# Patient Record
Sex: Male | Born: 1990 | Race: White | Hispanic: No | Marital: Single | Smoking: Current every day smoker
Health system: Southern US, Community
[De-identification: ages and names within clinical notes are randomized; demographics above are authoritative.]

## PROBLEM LIST (undated history)

## (undated) DIAGNOSIS — S62109A Fracture of unspecified carpal bone, unspecified wrist, initial encounter for closed fracture: Secondary | ICD-10-CM

## (undated) DIAGNOSIS — R569 Unspecified convulsions: Secondary | ICD-10-CM

---

## 2015-07-24 ENCOUNTER — Encounter (HOSPITAL_COMMUNITY): Payer: Self-pay | Admitting: Emergency Medicine

## 2015-07-24 ENCOUNTER — Emergency Department (HOSPITAL_COMMUNITY)
Admission: EM | Admit: 2015-07-24 | Discharge: 2015-07-24 | Disposition: A | Discharge status: Home / Self Care | Payer: Self-pay | Attending: Emergency Medicine | Admitting: Emergency Medicine

## 2015-07-24 ENCOUNTER — Emergency Department (HOSPITAL_COMMUNITY): Payer: Self-pay

## 2015-07-24 DIAGNOSIS — S60221A Contusion of right hand, initial encounter: Secondary | ICD-10-CM | POA: Insufficient documentation

## 2015-07-24 DIAGNOSIS — Y9289 Other specified places as the place of occurrence of the external cause: Secondary | ICD-10-CM | POA: Insufficient documentation

## 2015-07-24 DIAGNOSIS — F17210 Nicotine dependence, cigarettes, uncomplicated: Secondary | ICD-10-CM | POA: Insufficient documentation

## 2015-07-24 DIAGNOSIS — X58XXXA Exposure to other specified factors, initial encounter: Secondary | ICD-10-CM | POA: Insufficient documentation

## 2015-07-24 DIAGNOSIS — Y9389 Activity, other specified: Secondary | ICD-10-CM | POA: Insufficient documentation

## 2015-07-24 DIAGNOSIS — Y998 Other external cause status: Secondary | ICD-10-CM | POA: Insufficient documentation

## 2015-07-24 MED ORDER — IBUPROFEN 600 MG PO TABS: 600.0000 mg | ORAL_TABLET | Freq: Four times a day (QID) | ORAL | Status: DC | PRN

## 2015-07-24 NOTE — ED Notes (Signed)
Ace wrap applied per instructions to include fingers and wrist.

## 2015-07-24 NOTE — ED Notes (Signed)
Pt states that he punched a wall last week after finding out that his brother died and used hand to push off of chair yesterday and felt it pop and now is bruised and swelling.

## 2015-07-24 NOTE — Discharge Instructions (Signed)
Hand Contusion  A hand contusion is a deep bruise on your hand area. Contusions are the result of an injury that caused bleeding under the skin. The contusion may turn blue, purple, or yellow. Minor injuries will give you a painless contusion, but more severe contusions may stay painful and swollen for a few weeks.  CAUSES   A contusion is usually caused by a blow, trauma, or direct force to an area of the body.  SYMPTOMS    Swelling and redness of the injured area.   Discoloration of the injured area.   Tenderness and soreness of the injured area.   Pain.  DIAGNOSIS   The diagnosis can be made by taking a history and performing a physical exam. An X-ray, CT scan, or MRI may be needed to determine if there were any associated injuries, such as broken bones (fractures).  TREATMENT   Often, the best treatment for a hand contusion is resting, elevating, icing, and applying cold compresses to the injured area. Over-the-counter medicines may also be recommended for pain control.  HOME CARE INSTRUCTIONS    Put ice on the injured area.    Put ice in a plastic bag.    Place a towel between your skin and the bag.    Leave the ice on for 15-20 minutes, 03-04 times a day.   Only take over-the-counter or prescription medicines as directed by your caregiver. Your caregiver may recommend avoiding anti-inflammatory medicines (aspirin, ibuprofen, and naproxen) for 48 hours because these medicines may increase bruising.   If told, use an elastic wrap as directed. This can help reduce swelling. You may remove the wrap for sleeping, showering, and bathing. If your fingers become numb, cold, or blue, take the wrap off and reapply it more loosely.   Elevate your hand with pillows to reduce swelling.   Avoid overusing your hand if it is painful.  SEEK IMMEDIATE MEDICAL CARE IF:    You have increased redness, swelling, or pain in your hand.   Your swelling or pain is not relieved with medicines.   You have loss of feeling in  your hand or are unable to move your fingers.   Your hand turns cold or blue.   You have pain when you move your fingers.   Your hand becomes warm to the touch.   Your contusion does not improve in 2 days.  MAKE SURE YOU:    Understand these instructions.   Will watch your condition.   Will get help right away if you are not doing well or get worse.     This information is not intended to replace advice given to you by your health care provider. Make sure you discuss any questions you have with your health care provider.     Document Released: 12/20/2001 Document Revised: 03/24/2012 Document Reviewed: 12/22/2011  Elsevier Interactive Patient Education 2016 Elsevier Inc.

## 2015-07-25 NOTE — ED Provider Notes (Signed)
CSN: [id number]     Arrival date & time 07/24/15  1606 History   First MD Initiated Contact with Patient 07/24/15 1722     Chief Complaint  Patient presents with  . Hand Injury     (Consider location/radiation/quality/duration/timing/severity/associated sxs/prior Treatment) The history is provided by the patient and a relative.   Jacob Pennington is a 25 y.o. right handed male presenting with pain along the 2nd, 3rd and 4th mcp joints since punching a wall last week.  He endorses his pain was better until yesterday when he pushed up from a chair yesterday and now has increased pain and swelling at the base of his right index finger. Pain is constant, aching and worsened with movement.  It radiates across his dorsal distal hand.  He denies numbness or weakness in his fingers although movement worsens pain. He has had no treatment prior to arrival.   History reviewed. No pertinent past medical history. History reviewed. No pertinent past surgical history. History reviewed. No pertinent family history. Social History  Substance Use Topics  . Smoking status: Current Every Day Smoker -- 0.50 packs/day    Types: Cigarettes  . Smokeless tobacco: None  . Alcohol Use: No    Review of Systems  Constitutional: Negative for fever.  Musculoskeletal: Positive for joint swelling and arthralgias. Negative for myalgias.  Skin: Negative for wound.  Neurological: Negative for weakness and numbness.      Allergies  Review of patient's allergies indicates no known allergies.  Home Medications   Prior to Admission medications   Medication Sig Start Date End Date Taking? Authorizing Provider  ibuprofen (ADVIL,MOTRIN) 600 MG tablet Take 1 tablet (600 mg total) by mouth every 6 (six) hours as needed. 07/24/15   Burgess Amor, PA-C   BP 110/75 mmHg  Pulse 75  Temp(Src) 98 F (36.7 C) (Oral)  Resp 16  Ht 5\' 7"  (1.702 m)  Wt 61.236 kg  BMI 21.14 kg/m2  SpO2 100% Physical Exam  Constitutional: He  appears well-developed and well-nourished.  HENT:  Head: Atraumatic.  Neck: Normal range of motion.  Cardiovascular:  Pulses equal bilaterally  Musculoskeletal: He exhibits tenderness.       Hands: Neurological: He is alert. He has normal strength. He displays normal reflexes. No sensory deficit.  Equal grip strength.  Skin: Skin is warm and dry.  Psychiatric: He has a normal mood and affect.    ED Course  Procedures (including critical care time) Labs Review Labs Reviewed - No data to display  Imaging Review Dg Hand Complete Right  07/24/2015  CLINICAL DATA:  Pain in the hand. The patient punched a wall 1 week ago. Swelling and bruising along the third and fourth metacarpals. EXAM: RIGHT HAND - COMPLETE 3+ VIEW COMPARISON:  None. FINDINGS: Sclerosis and collapse of the lunate noted. Appearance compatible with avascular necrosis/lunatomalacia. No metacarpal or phalangeal fracture is observed. Elevated scapholunate angle. IMPRESSION: 1. Kienbock's lunatomalacia, with collapse, fragmentation, and sclerosis of the lunate and elevated scapholunate angle. 2. I do not observe a fracture of the metacarpals or phalanges. Electronically Signed   By: Gaylyn Rong M.D.   On: 07/24/2015 16:30   I have personally reviewed and evaluated these images and lab results as part of my medical decision-making.   EKG Interpretation None      MDM   Final diagnoses:  Hand contusion, right, initial encounter    xrays reviewed and discussed with patient.  He endorses old fall injury to right hand/wrist with chronic  pain in the wrist and decreased ability at extension.  He has never sought tx for this injury which he denies being worsened today.  Discussed xrays findings and recommended f/u with ortho, referral given.  Ace wrap supplied, advised RICE, ibuprofen.  Encouraged to f/u re old injury as well.  The patient appears reasonably screened and/or stabilized for discharge and I doubt any other  medical condition or other [facility] requiring further screening, evaluation, or treatment in the ED at this time prior to discharge.    Burgess AmorJulie Ademola Vert, PA-C 07/25/15 1426  Samuel JesterKathleen McManus, DO 07/28/15 [phone]

## 2015-08-05 ENCOUNTER — Encounter (HOSPITAL_COMMUNITY): Payer: Self-pay | Admitting: *Deleted

## 2015-08-05 ENCOUNTER — Emergency Department (HOSPITAL_COMMUNITY)
Admission: EM | Admit: 2015-08-05 | Discharge: 2015-08-06 | Disposition: A | Discharge status: Home / Self Care | Payer: Self-pay | Attending: Emergency Medicine | Admitting: Emergency Medicine

## 2015-08-05 DIAGNOSIS — R0602 Shortness of breath: Secondary | ICD-10-CM | POA: Insufficient documentation

## 2015-08-05 DIAGNOSIS — Z79899 Other long term (current) drug therapy: Secondary | ICD-10-CM | POA: Insufficient documentation

## 2015-08-05 DIAGNOSIS — W01198A Fall on same level from slipping, tripping and stumbling with subsequent striking against other object, initial encounter: Secondary | ICD-10-CM | POA: Insufficient documentation

## 2015-08-05 DIAGNOSIS — S299XXA Unspecified injury of thorax, initial encounter: Secondary | ICD-10-CM | POA: Insufficient documentation

## 2015-08-05 DIAGNOSIS — G4489 Other headache syndrome: Secondary | ICD-10-CM | POA: Insufficient documentation

## 2015-08-05 DIAGNOSIS — Y93E1 Activity, personal bathing and showering: Secondary | ICD-10-CM | POA: Insufficient documentation

## 2015-08-05 DIAGNOSIS — R000 Tachycardia, unspecified: Secondary | ICD-10-CM | POA: Insufficient documentation

## 2015-08-05 DIAGNOSIS — Y9289 Other specified places as the place of occurrence of the external cause: Secondary | ICD-10-CM | POA: Insufficient documentation

## 2015-08-05 DIAGNOSIS — F17210 Nicotine dependence, cigarettes, uncomplicated: Secondary | ICD-10-CM | POA: Insufficient documentation

## 2015-08-05 DIAGNOSIS — F419 Anxiety disorder, unspecified: Secondary | ICD-10-CM | POA: Insufficient documentation

## 2015-08-05 DIAGNOSIS — R55 Syncope and collapse: Secondary | ICD-10-CM | POA: Insufficient documentation

## 2015-08-05 DIAGNOSIS — Y998 Other external cause status: Secondary | ICD-10-CM | POA: Insufficient documentation

## 2015-08-05 HISTORY — DX: Unspecified convulsions: R56.9

## 2015-08-05 LAB — I-STAT CHEM 8, ED
BUN: 21 mg/dL — ABNORMAL HIGH (ref 6–20)
CALCIUM ION: 1.12 mmol/L (ref 1.12–1.23)
CREATININE: 1.2 mg/dL (ref 0.61–1.24)
Chloride: 104 mmol/L (ref 101–111)
GLUCOSE: 100 mg/dL — AB (ref 65–99)
HCT: 49 % (ref 39.0–52.0)
HEMOGLOBIN: 16.7 g/dL (ref 13.0–17.0)
POTASSIUM: 3.8 mmol/L (ref 3.5–5.1)
Sodium: 143 mmol/L (ref 135–145)
TCO2: 26 mmol/L (ref 0–100)

## 2015-08-05 LAB — URINALYSIS, ROUTINE W REFLEX MICROSCOPIC
BILIRUBIN URINE: NEGATIVE
GLUCOSE, UA: NEGATIVE mg/dL
HGB URINE DIPSTICK: NEGATIVE
KETONES UR: 15 mg/dL — AB
Leukocytes, UA: NEGATIVE
Nitrite: NEGATIVE
PROTEIN: NEGATIVE mg/dL
Specific Gravity, Urine: 1.025 (ref 1.005–1.030)
pH: 6 (ref 5.0–8.0)

## 2015-08-05 LAB — RAPID URINE DRUG SCREEN, HOSP PERFORMED
Amphetamines: NOT DETECTED
Barbiturates: NOT DETECTED
Benzodiazepines: POSITIVE — AB
Cocaine: NOT DETECTED
OPIATES: NOT DETECTED
Tetrahydrocannabinol: POSITIVE — AB

## 2015-08-05 LAB — I-STAT TROPONIN, ED: TROPONIN I, POC: 0 ng/mL (ref 0.00–0.08)

## 2015-08-05 NOTE — ED Notes (Signed)
Pt states that he became lightheaded when he started to stand and woke up on the floor that happened yesterday, pt reports that this is the second time he has had this happen,
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
## 2015-08-06 ENCOUNTER — Emergency Department (HOSPITAL_COMMUNITY): Payer: Self-pay

## 2015-08-06 NOTE — Discharge Instructions (Signed)
Follow up with Dr. Gerilyn Pilgrim at [address] [place] [place], [place] [address]    [phone]

## 2015-11-19 ENCOUNTER — Encounter (HOSPITAL_COMMUNITY): Payer: Self-pay | Admitting: Emergency Medicine

## 2015-11-19 ENCOUNTER — Emergency Department (HOSPITAL_COMMUNITY)
Admission: EM | Admit: 2015-11-19 | Discharge: 2015-11-19 | Disposition: A | Discharge status: Home / Self Care | Payer: Self-pay | Attending: Emergency Medicine | Admitting: Emergency Medicine

## 2015-11-19 DIAGNOSIS — K047 Periapical abscess without sinus: Secondary | ICD-10-CM | POA: Insufficient documentation

## 2015-11-19 DIAGNOSIS — R590 Localized enlarged lymph nodes: Secondary | ICD-10-CM | POA: Insufficient documentation

## 2015-11-19 DIAGNOSIS — F17210 Nicotine dependence, cigarettes, uncomplicated: Secondary | ICD-10-CM | POA: Insufficient documentation

## 2015-11-19 MED ORDER — ACETAMINOPHEN 325 MG PO TABS
650.0000 mg | ORAL_TABLET | Freq: Once | ORAL | Status: AC
  Administered 2015-11-19: 650 mg via ORAL
  Filled 2015-11-19: qty 2

## 2015-11-19 MED ORDER — CEFTRIAXONE SODIUM 1 G IJ SOLR
1.0000 g | Freq: Once | INTRAMUSCULAR | Status: AC
  Administered 2015-11-19: 1 g via INTRAMUSCULAR
  Filled 2015-11-19: qty 10

## 2015-11-19 MED ORDER — AMOXICILLIN 500 MG PO CAPS: 500.0000 mg | ORAL_CAPSULE | Freq: Three times a day (TID) | ORAL | Status: AC

## 2015-11-19 MED ORDER — IBUPROFEN 800 MG PO TABS: 800.0000 mg | ORAL_TABLET | Freq: Three times a day (TID) | ORAL | Status: DC

## 2015-11-19 MED ORDER — IBUPROFEN 800 MG PO TABS
800.0000 mg | ORAL_TABLET | Freq: Once | ORAL | Status: AC
  Administered 2015-11-19: 800 mg via ORAL
  Filled 2015-11-19: qty 1

## 2015-11-19 MED ORDER — LIDOCAINE HCL (PF) 1 % IJ SOLN
INTRAMUSCULAR | Status: AC
  Filled 2015-11-19: qty 5

## 2015-11-19 NOTE — Discharge Instructions (Signed)
Please swish the abscess area with salt water swishes three or four times daily.[employer] 211 is a great source of information about community services available.  Access by dialing 2-1-1 from anywhere in [place], or by website -  PooledIncome.plwww.nc211.org.   Other Local Resources (Updated 07/2015)  Dental  Care   Services    Phone Number and Address  Cost  [facility] For children 210 - 26 years of age:   Cleaning  Tooth brushing/flossing instruction  Sealants, fillings, crowns  Extractions  Emergency treatment  [phone] [address] [place], [place] [address] Charges based on family income.  Medicaid and some insurance plans accepted.     [facility]  Cleaning  Sealants, fillings, crowns  Extractions  Emergency treatment [phone] [address] [place], [place]  Pregnant women 26 years of age or older with a Medicaid card  [facility] [place]  Cleaning  Sealants, fillings, crowns  Extractions  Emergency treatment [phone] [address] [place], [place] Pregnant women 26 years of age or older with a Medicaid card  [facility] For children 450 - 26 years of age:   Cleaning  Tooth brushing/flossing instruction  Sealants, fillings, crowns  Extractions  Emergency treatment Limited orthodontic services for patients with Medicaid [phone] [address] [place], [place] [address] Medicaid and [facility] Health Choice cover for children up to age 26 and pregnant women.  Parents of children up to age 26 without Medicaid pay a reduced fee at time of service.  [facility] [place] For children 590 - 26 years of age:   Cleaning  Tooth brushing/flossing instruction  Sealants, fillings,  crowns  Extractions  Emergency treatment Limited orthodontic services for patients with Medicaid [phone] [address] [place], [place].  Medicaid and [place] Health Choice cover for children up to age 26 and pregnant women.  Parents of children up to age 26 without Medicaid pay a reduced fee.  [facility]  Cleaning  Sealants, fillings, crowns  Extractions  Hours: Tuesdays and Thursdays, 4:15 - 8 pm [phone] [address], [address] [place], [place] [address] Services free of charge to [facility] residents ages 18-64 who do not have health insurance, Medicare, [place], or [place] benefits and fall within federal poverty guidelines  [facility]    Provides dental care in addition to primary medical care, nutritional counseling, and pharmacy:  Nurse, mental healthCleaning  Sealants, fillings, crowns  Extractions                  [phone] [facility], [address] [place], [place]  [phone] [facility], [address] [place], [place]  [phone] [facility] [place], [place]  [phone] [facility], [address] [place], [place]  [phone] [facility] [address] [place], [place] Accepts [place], [place]are, most insurance.  Also provides services available to all with fees adjusted based on ability to pay.    [facility]  Cleaning  Tooth brushing/flossing instruction  Sealants, fillings, crowns  Extractions  Emergency treatment Hours: Tuesdays, Thursdays, and Fridays from 8 am to 5 pm by appointment only. [phone] [address] [place], [place] [phone] [facility] residents with Medicaid (depending  on eligibility) and children with [place] Health Choice - call for more information.  [facility]  Extractions only  Hours: 2nd and  4th Thursday of each month from 6:30 am - 9 am.   [phone]. [phone] [place], [place] [address] Ages 59 and older only.  Patients are seen on a first come, first served basis.  [employer]  [employer]  Extractions  Orthodontics  Endodontics  Implants/Crowns/Bridges  Complete and partial dentures [phone] [place], [place] Patients must complete an application for services.  There is often a waiting list.   th salt water swishes 34 times daily. It is extremely important that you see a dentist systems possible. Use Amoxil and ibuprofen 3 times daily with a meal.

## 2015-11-19 NOTE — ED Provider Notes (Signed)
CSN: [id number]     Arrival date & time 11/19/15  1947 History   First MD Initiated Contact with Patient 11/19/15 2026     Chief Complaint  Patient presents with  . Dental Pain     (Consider location/radiation/quality/duration/timing/severity/associated sxs/prior Treatment) Patient is a 25 y.o. male presenting with tooth pain. The history is provided by the patient.  Dental Pain Location:  Lower Lower teeth location:  20/LL 2nd bicuspid, 19/LL 1st molar, 18/LL 2nd molar and 17/LL 3rd molar Quality:  Throbbing, aching and pressure-like Severity:  Moderate Onset quality:  Gradual Duration:  4 days Timing:  Intermittent Progression:  Worsening Chronicity:  Recurrent Context: abscess, dental caries and poor dentition   Relieved by:  Nothing Worsened by:  Nothing tried Associated symptoms: facial swelling, gum swelling and headaches   Associated symptoms: no fever   Risk factors: lack of dental care and smoking   Risk factors: no diabetes and no immunosuppression     Past Medical History  Diagnosis Date  . 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 No pertinent past surgical history. History reviewed. No pertinent family history. Social History  Substance Use Topics  . Smoking status: Current Every Day Smoker -- 0.50 packs/day    Types: Cigarettes  . Smokeless tobacco: None  . Alcohol Use: No     Comment: occasionally    Review of Systems  Constitutional: Negative for fever.  HENT: Positive for dental problem and facial swelling.   Neurological: Positive for headaches.  All other systems reviewed and are negative.     Allergies  Review of patient's allergies indicates no known allergies.  Home Medications   Prior to Admission medications   Medication Sig Start Date End Date Taking? Authorizing Provider  aspirin-acetaminophen-caffeine (EXCEDRIN MIGRAINE) [id number] MG tablet Take 2 tablets by mouth every 6 (six) hours as needed  for headache.    Historical Provider, MD  Aspirin-Salicylamide-Caffeine (BC HEADACHE) 325-95-16 MG TABS Take 1 packet by mouth daily as needed (for pain).    Historical Provider, MD  HYDROcodone-acetaminophen (NORCO/VICODIN) 5-325 MG tablet Take 1 tablet by mouth every 6 (six) hours as needed for moderate pain.    Historical Provider, MD  ibuprofen (ADVIL,MOTRIN) 600 MG tablet Take 1 tablet (600 mg total) by mouth every 6 (six) hours as needed. Patient not taking: Reported on 08/05/2015 07/24/15   Burgess Amor, PA-C  omeprazole (PRILOSEC) 20 MG capsule Take 20 mg by mouth daily.    Historical Provider, MD   BP 139/95 mmHg  Pulse 100  Temp(Src) 99.1 F (37.3 C) (Oral)  Resp 14  Ht  (1.702 m)  Wt 58.968 kg  BMI 20.36 kg/m2  SpO2 100% Physical Exam  Constitutional: He is oriented to person, place, and time. He appears well-developed and well-nourished.  Non-toxic appearance.  HENT:  Head: Normocephalic.  Right Ear: Tympanic membrane and external ear normal.  Left Ear: Tympanic membrane and external ear normal.  There are multiple dental caries noted throughout the mouth. There is swelling of the left lower gum with an abscess present. He there is tenderness to palpation of the lower jaw, extending into the upper portion of the neck. The airway is patent. There is no swelling under the tongue.  Eyes: EOM and lids are normal. Pupils are equal, round, and reactive to light.  Neck: Normal range of motion. Neck supple. Carotid bruit is not present. No tracheal deviation present.  Cardiovascular:  Normal rate, regular rhythm, normal heart sounds, intact distal pulses and normal pulses.  Exam reveals no gallop and no friction rub.   No murmur heard. Pulmonary/Chest: Breath sounds normal. No respiratory distress.  Abdominal: Soft. Bowel sounds are normal. There is no tenderness. There is no guarding.  Musculoskeletal: Normal range of motion.  Lymphadenopathy:       Head (right side): No  submandibular adenopathy present.       Head (left side): No submandibular adenopathy present.    He has cervical adenopathy.  Neurological: He is alert and oriented to person, place, and time. He has normal strength. No cranial nerve deficit or sensory deficit.  Skin: Skin is warm and dry.  Psychiatric: He has a normal mood and affect. His speech is normal.  Nursing note and vitals reviewed.   ED Course  Dental Date/Time: 11/19/2015 9:29 PM Performed by: Ivery QualeBRYANT, Chele Cornell Authorized by: Ivery QualeBRYANT, Lamontae Ricardo Consent: Verbal consent obtained. Risks and benefits: risks, benefits and alternatives were discussed Consent given by: patient Patient understanding: patient states understanding of the procedure being performed Required items: required blood products, implants, devices, and special equipment available Patient identity confirmed: arm band Time out: Immediately prior to procedure a "time out" was called to verify the correct patient, procedure, equipment, support staff and site/side marked as required. Preparation: Patient was prepped and draped in the usual sterile fashion. Local anesthesia used: yes Anesthesia: nerve block Local anesthetic: lidocaine 2% with epinephrine Anesthetic total: 4 ml Patient sedated: no Patient tolerance: Patient tolerated the procedure well with no immediate complications Comments: Performed incision and drainage of dental abscess using a #11 scalpel, with copious amount of purulent material drained. Significant reduction in abscess at the left lower jaw. Patient reports being much more comfortable after the procedure.   (including critical care time) Labs Review Labs Reviewed - No data to display  Imaging Review No results found. I have personally reviewed and evaluated these images and lab results as part of my medical decision-making.   EKG Interpretation None      MDM  Patient noted to have multiple dental caries present. Particularly bad at the  left lower jaw area. Patient had a abscess present. This was incised and drained with copious amount of purulent material. Patient was treated with intramuscular Rocephin, and placed on Amoxil. I have advised the patient to see the dentist systems possible. We discussed possible negative outcomes. Prescription for Amoxil and ibuprofen given to the patient. Patient acknowledges understanding of the importance of seeing the dentist and the discharge plan.    Final diagnoses:  Dental abscess    *I have reviewed nursing notes, vital signs, and all appropriate lab and imaging results for this patient.[address]    Dahmir Epperly, PA-C 11/20/15 1233  Doug SouSam Jacubowitz, MD 11/21/15 1157

## 2015-11-19 NOTE — ED Notes (Addendum)
Patient complaining of lower left dental pain x 3-4 days ago. Swelling noted to left side of face.

## 2015-12-30 ENCOUNTER — Encounter (HOSPITAL_COMMUNITY): Payer: Self-pay

## 2015-12-30 ENCOUNTER — Emergency Department (HOSPITAL_COMMUNITY)
Admission: EM | Admit: 2015-12-30 | Discharge: 2015-12-30 | Disposition: A | Discharge status: Home / Self Care | Payer: Self-pay | Attending: Emergency Medicine | Admitting: Emergency Medicine

## 2015-12-30 DIAGNOSIS — K05219 Aggressive periodontitis, localized, unspecified severity: Secondary | ICD-10-CM

## 2015-12-30 DIAGNOSIS — F17210 Nicotine dependence, cigarettes, uncomplicated: Secondary | ICD-10-CM | POA: Insufficient documentation

## 2015-12-30 DIAGNOSIS — Z7982 Long term (current) use of aspirin: Secondary | ICD-10-CM | POA: Insufficient documentation

## 2015-12-30 DIAGNOSIS — K0520 Aggressive periodontitis, unspecified: Secondary | ICD-10-CM | POA: Insufficient documentation

## 2015-12-30 MED ORDER — CLINDAMYCIN HCL 300 MG PO CAPS: 300.0000 mg | ORAL_CAPSULE | Freq: Four times a day (QID) | ORAL | Status: AC

## 2015-12-30 MED ORDER — CLINDAMYCIN HCL 150 MG PO CAPS
300.0000 mg | ORAL_CAPSULE | Freq: Once | ORAL | Status: AC
  Administered 2015-12-30: 300 mg via ORAL
  Filled 2015-12-30: qty 2

## 2015-12-30 NOTE — ED Provider Notes (Signed)
CSN: [id number]     Arrival date & time 12/30/15  [id number] History   First MD Initiated Contact with Patient 12/30/15 [phone]     Chief Complaint  Patient presents with  . Abscess     (Consider location/radiation/quality/duration/timing/severity/associated sxs/prior Treatment) HPI...Marland Kitchen.Marland Kitchen.Swelling left lower jaw for 24 hours. Poor dental care in the past. No fever, sweats, chills, stiff neck. Similar abscess approximately one month ago. He is normally healthy.  Past Medical History  Diagnosis Date  . 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 No pertinent past surgical history. History reviewed. No pertinent family history. Social History  Substance Use Topics  . Smoking status: Current Every Day Smoker -- 0.50 packs/day    Types: Cigarettes  . Smokeless tobacco: None  . Alcohol Use: No     Comment: occasionally    Review of Systems  All other systems reviewed and are negative.     Allergies  Review of patient's allergies indicates no known allergies.  Home Medications   Prior to Admission medications   Medication Sig Start Date End Date Taking? Authorizing Provider  amoxicillin (AMOXIL) 500 MG capsule Take 1 capsule (500 mg total) by mouth 3 (three) times daily. 11/19/15   Ivery QualeHobson Bryant, PA-C  aspirin-acetaminophen-caffeine (EXCEDRIN MIGRAINE) 9094338849250-250-65 MG tablet Take 2 tablets by mouth every 6 (six) hours as needed for headache.    Historical Provider, MD  Aspirin-Salicylamide-Caffeine (BC HEADACHE) 325-95-16 MG TABS Take 1 packet by mouth daily as needed (for pain).    Historical Provider, MD  clindamycin (CLEOCIN) 300 MG capsule Take 1 capsule (300 mg total) by mouth 4 (four) times daily. 12/30/15   Donnetta HutchingBrian Amarys Sliwinski, MD  HYDROcodone-acetaminophen (NORCO/VICODIN) 5-325 MG tablet Take 1 tablet by mouth every 6 (six) hours as needed for moderate pain.    Historical Provider, MD  ibuprofen (ADVIL,MOTRIN) 800 MG tablet Take 1 tablet (800 mg total) by mouth  3 (three) times daily. 11/19/15   Ivery QualeHobson Bryant, PA-C  omeprazole (PRILOSEC) 20 MG capsule Take 20 mg by mouth daily.    Historical Provider, MD   BP 140/118 mmHg  Pulse 93  Ht 5\' 8"  (1.727 m)  Wt 130 lb (58.968 kg)  BMI 19.77 kg/m2  SpO2 99% Physical Exam  Constitutional: He is oriented to person, place, and time. He appears well-developed and well-nourished.  HENT:  Minimal swelling in left lower facial area. Tooth #35 through #38 are decayed and rotten  Eyes: Conjunctivae and EOM are normal. Pupils are equal, round, and reactive to light.  Neck: Normal range of motion. Neck supple.  Cardiovascular: Normal rate and regular rhythm.   Pulmonary/Chest: Effort normal and breath sounds normal.  Abdominal: Soft. Bowel sounds are normal.  Musculoskeletal: Normal range of motion.  Neurological: He is alert and oriented to person, place, and time.  Skin: Skin is warm and dry.  Psychiatric: He has a normal mood and affect. His behavior is normal.  Nursing note and vitals reviewed.   ED Course  Procedures (including critical care time) Labs Review Labs Reviewed - No data to display  Imaging Review No results found. I have personally reviewed and evaluated these images and lab results as part of my medical decision-making.   EKG Interpretation None      MDM   Final diagnoses:  Gingival abscess    Patient is stable. Rx clindamycin 300 mg 4 times a day. Referral to dentist.    Donnetta HutchingBrian Kathaleya Mcduffee, MD  12/30/15 0849 

## 2015-12-30 NOTE — Discharge Instructions (Signed)
Richard mouth with salt water. Prescriptions for antibiotic 4 times a day. You will need dental follow-up.

## 2015-12-30 NOTE — ED Notes (Signed)
Pt c/o of abscess to left mouth. States was seen last month for abscess in the same place, it was drained but he did not follow up with a dentist afterward. Pt has broken tooth on that side

## 2016-06-01 ENCOUNTER — Emergency Department (HOSPITAL_COMMUNITY)
Admission: EM | Admit: 2016-06-01 | Discharge: 2016-06-01 | Disposition: A | Discharge status: Home / Self Care | Payer: Self-pay | Attending: Emergency Medicine | Admitting: Emergency Medicine

## 2016-06-01 ENCOUNTER — Encounter (HOSPITAL_COMMUNITY): Payer: Self-pay | Admitting: Emergency Medicine

## 2016-06-01 ENCOUNTER — Emergency Department (HOSPITAL_COMMUNITY): Payer: Self-pay

## 2016-06-01 DIAGNOSIS — W228XXA Striking against or struck by other objects, initial encounter: Secondary | ICD-10-CM | POA: Insufficient documentation

## 2016-06-01 DIAGNOSIS — F17210 Nicotine dependence, cigarettes, uncomplicated: Secondary | ICD-10-CM | POA: Insufficient documentation

## 2016-06-01 DIAGNOSIS — Y998 Other external cause status: Secondary | ICD-10-CM | POA: Insufficient documentation

## 2016-06-01 DIAGNOSIS — Y929 Unspecified place or not applicable: Secondary | ICD-10-CM | POA: Insufficient documentation

## 2016-06-01 DIAGNOSIS — Y9339 Activity, other involving climbing, rappelling and jumping off: Secondary | ICD-10-CM | POA: Insufficient documentation

## 2016-06-01 DIAGNOSIS — Z79899 Other long term (current) drug therapy: Secondary | ICD-10-CM | POA: Insufficient documentation

## 2016-06-01 DIAGNOSIS — S92525A Nondisplaced fracture of medial phalanx of left lesser toe(s), initial encounter for closed fracture: Secondary | ICD-10-CM | POA: Insufficient documentation

## 2016-06-01 DIAGNOSIS — Z7982 Long term (current) use of aspirin: Secondary | ICD-10-CM | POA: Insufficient documentation

## 2016-06-01 NOTE — Discharge Instructions (Signed)
You do have a very tiny fracture in your toe.  Ice, elevate, Tylenol or Motrin for pain, Buddy tape

## 2016-06-01 NOTE — ED Notes (Signed)
Returned from XR 

## 2016-06-01 NOTE — ED Notes (Signed)
Patient transported to X-ray 

## 2016-06-01 NOTE — ED Provider Notes (Signed)
[facility]-EMERGENCY DEPT Provider Note   CSN: [id number] Arrival date & time: 06/01/16  0740   By signing my name below, I, Valentino SaxonBianca Contreras, attest that this documentation has been prepared under the direction and in the presence of Donnetta HutchingBrian Monnie Gudgel, MD. Electronically Signed: Valentino SaxonBianca Contreras, ED Scribe. 06/01/16. 8:38 AM.  History   Chief Complaint Chief Complaint  Patient presents with  . Toe Injury   HPI Comments: Jacob Pennington is a 25 y.o. male who presents to the Emergency Department complaining of sudden onset, left toe injury that occurred two days ago. Pt reports he was jumping on trampoline when he came down and striked his left, second digit toe. Pt states he felt something pop in his foot. Pt reports he was able to ambulate after fall, but had accompanied pain to affected area. He notes bruising to the affected area with associated pain under the second digit toe. Pt reports no modifying factors noted. He denies swelling. No additional complaints at this time.    The history is provided by the patient. No language interpreter was used.    Past Medical History:  Diagnosis Date  . Seizures (HCC)    as a child due to blunt force trauma,     There are no active problems to display for this patient.   History reviewed. No pertinent surgical history.     Home Medications    Prior to Admission medications   Medication Sig Start Date End Date Taking? Authorizing Provider  amoxicillin (AMOXIL) 500 MG capsule Take 1 capsule (500 mg total) by mouth 3 (three) times daily. 11/19/15   Ivery QualeHobson Bryant, PA-C  aspirin-acetaminophen-caffeine (EXCEDRIN MIGRAINE) [id number] MG tablet Take 2 tablets by mouth every 6 (six) hours as needed for headache.    Historical Provider, MD  Aspirin-Salicylamide-Caffeine (BC HEADACHE) 325-95-16 MG TABS Take 1 packet by mouth daily as needed (for pain).    Historical Provider, MD  clindamycin (CLEOCIN) 300 MG capsule Take 1 capsule (300 mg total) by mouth 4  (four) times daily. 12/30/15   Donnetta HutchingBrian Nyella Eckels, MD  HYDROcodone-acetaminophen (NORCO/VICODIN) 5-325 MG tablet Take 1 tablet by mouth every 6 (six) hours as needed for moderate pain.    Historical Provider, MD  ibuprofen (ADVIL,MOTRIN) 800 MG tablet Take 1 tablet (800 mg total) by mouth 3 (three) times daily. 11/19/15   Ivery QualeHobson Bryant, PA-C  omeprazole (PRILOSEC) 20 MG capsule Take 20 mg by mouth daily.    Historical Provider, MD    Family History History reviewed. No pertinent family history.  Social History Social History  Substance Use Topics  . Smoking status: Current Every Day Smoker    Packs/day: 1.00    Types: Cigarettes  . Smokeless tobacco: Never Used  . Alcohol use No     Comment: occasionally     Allergies   Patient has no known allergies.   Review of Systems Review of Systems  Musculoskeletal: Positive for arthralgias (left foot, second digit toe). Negative for joint swelling.  All other systems reviewed and are negative.    Physical Exam Updated Vital Signs BP 153/90 (BP Location: Right Arm)   Pulse 92   Temp 97.9 F (36.6 C) (Oral)   Resp 18   Ht 5\' 7"  (1.702 m)   Wt 230 lb (104.3 kg)   SpO2 98%   BMI 36.02 kg/m   Physical Exam  Constitutional: He is oriented to person, place, and time. He appears well-developed and well-nourished.  HENT:  Head: Normocephalic and atraumatic.  Eyes: Conjunctivae  are normal.  Neck: Neck supple.  Musculoskeletal: Normal range of motion. He exhibits tenderness.  Left foot, Second digit toe ecchymotic and tender circumferentially   Neurological: He is alert and oriented to person, place, and time.  Skin: Skin is warm and dry.  Psychiatric: He has a normal mood and affect. His behavior is normal.  Nursing note and vitals reviewed.    ED Treatments / Results   DIAGNOSTIC STUDIES: Oxygen Saturation is 98% on RA, normal by my interpretation.    COORDINATION OF CARE: 8:25 AM Discussed treatment plan with pt at bedside which  includes left foot XR and pt agreed to plan.   Labs (all labs ordered are listed, but only abnormal results are displayed) Labs Reviewed - No data to display  EKG  EKG Interpretation None       Radiology Dg Foot Complete Left  Result Date: 06/01/2016 CLINICAL DATA:  Trampoline injury.  Second toe pain. EXAM: LEFT FOOT - COMPLETE 3+ VIEW COMPARISON:  None. FINDINGS: There is a fracture at the base of the left second toe middle phalanx. Fracture fragment is minimally displaced. No additional acute bony abnormality. Joint spaces are maintained. IMPRESSION: Minimally displaced fracture at the base of the left second toe middle phalanx. Electronically Signed   By: Charlett NoseKevin  Dover M.D.   On: 06/01/2016 08:22    Procedures Procedures (including critical care time)  Medications Ordered in ED Medications - No data to display   Initial Impression / Assessment and Plan / ED Course  I have reviewed the triage vital signs and the nursing notes.  Pertinent labs & imaging results that were available during my care of the patient were reviewed by me and considered in my medical decision making (see chart for details).  Clinical Course     Pt was advised to use buddy tape to keep left second digit toe stabilized. He was told to find a comfort point when wearing his shoes. Tylenol and ibuprofen will be used for pain management. He was also advised to ice and elevate his left foot to help with swelling, if needed.    X-ray reveals a minimally displaced fracture at the base of the mid phalanx of the left second toe. Discussed with patient. Buddy tape.  Final Clinical Impressions(s) / ED Diagnoses   Final diagnoses:  Closed nondisplaced fracture of middle phalanx of lesser toe of left foot, initial encounter    New Prescriptions New Prescriptions   No medications on file    I personally performed the services described in this documentation, which was scribed in my presence. The recorded  information has been reviewed and is accurate.      Donnetta HutchingBrian Ace Bergfeld, MD 06/01/16 [phone]

## 2016-06-01 NOTE — ED Triage Notes (Signed)
Pt reports was jumping on trampoline x2 days ago and reports second toe on left foot pain ever since. Moderate contusion noted to second toe on left foot. nad noted. Distal pulses moderate.

## 2017-05-05 IMAGING — DX DG FOOT COMPLETE 3+V*L*
3 series · 3 of 3 positions shown · non-contrast
Comparison: None.

CLINICAL DATA: Trampoline injury.  Second toe pain.

EXAM:
LEFT FOOT - COMPLETE 3+ VIEW

[foot ap]
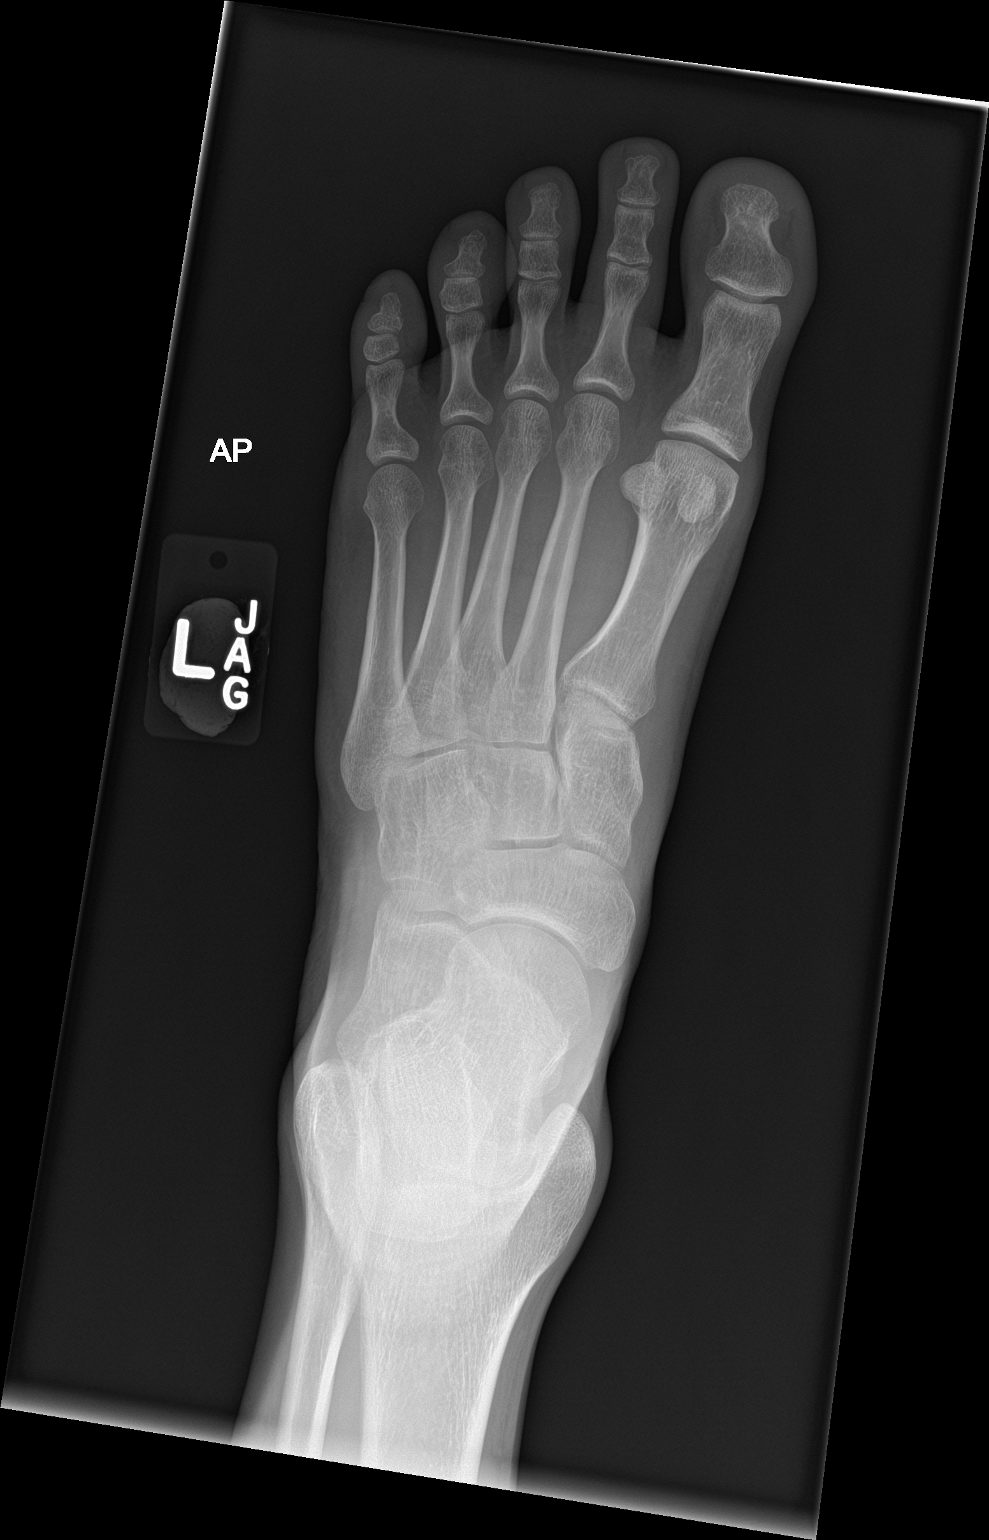

[foot obl]
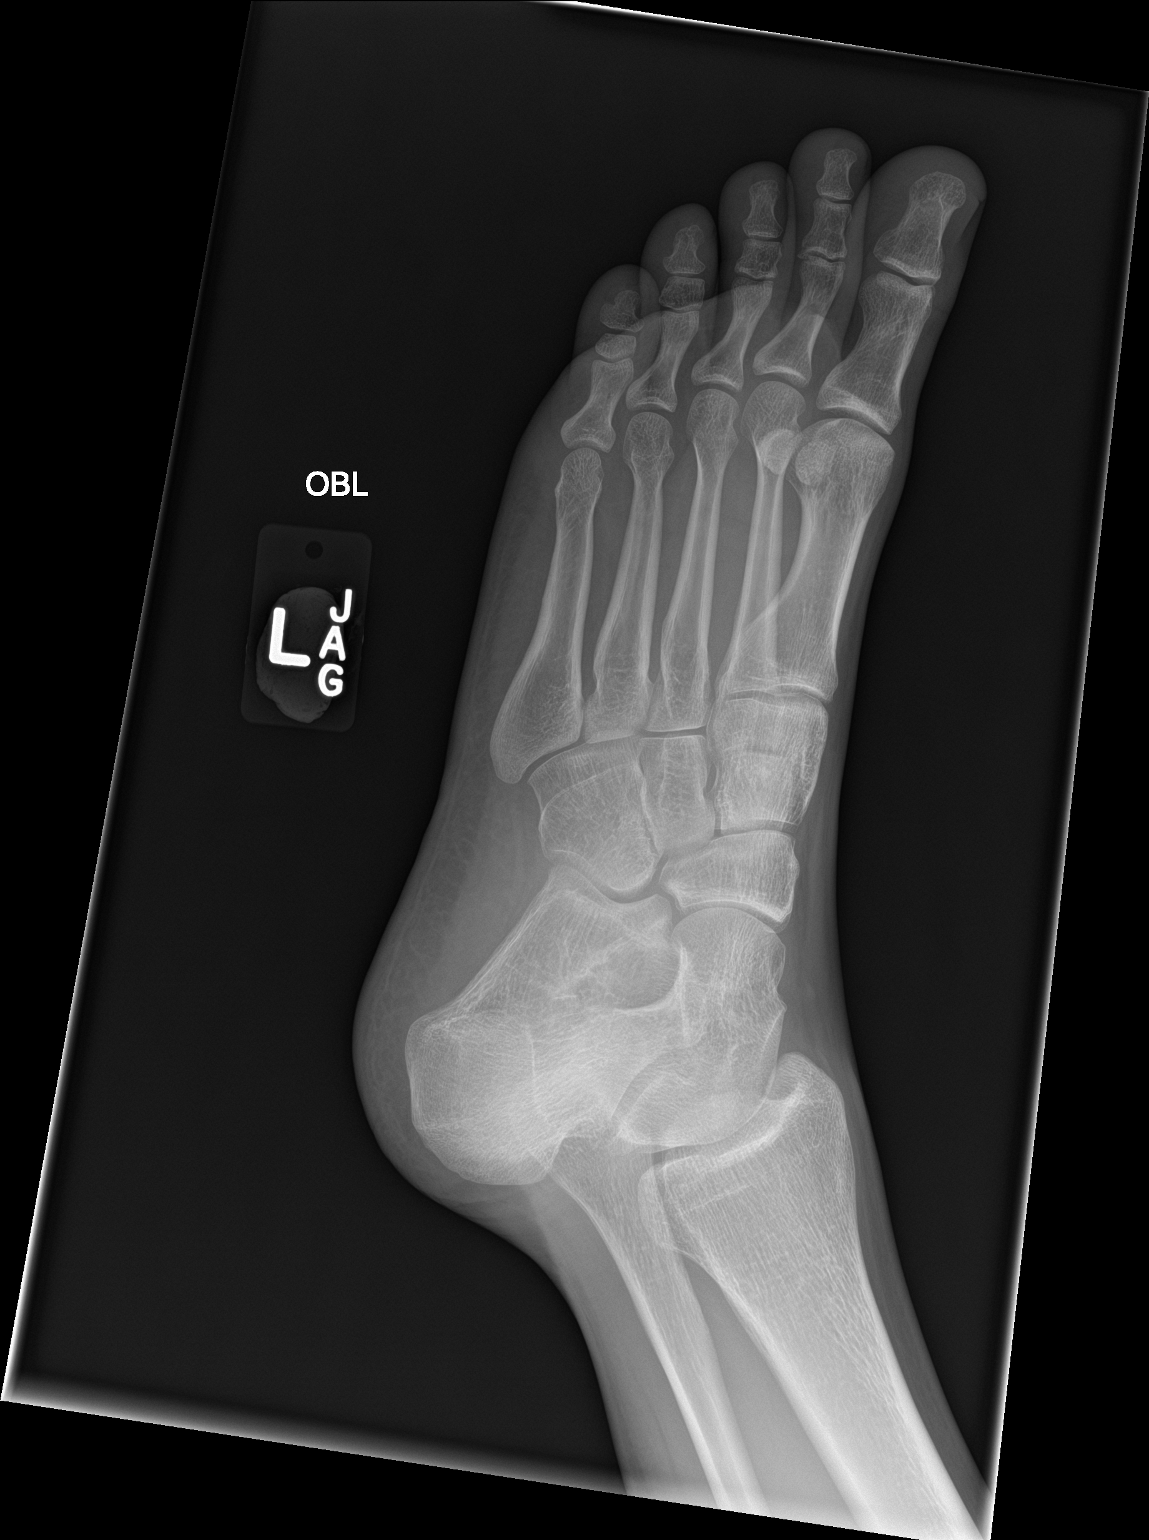

[foot lat]
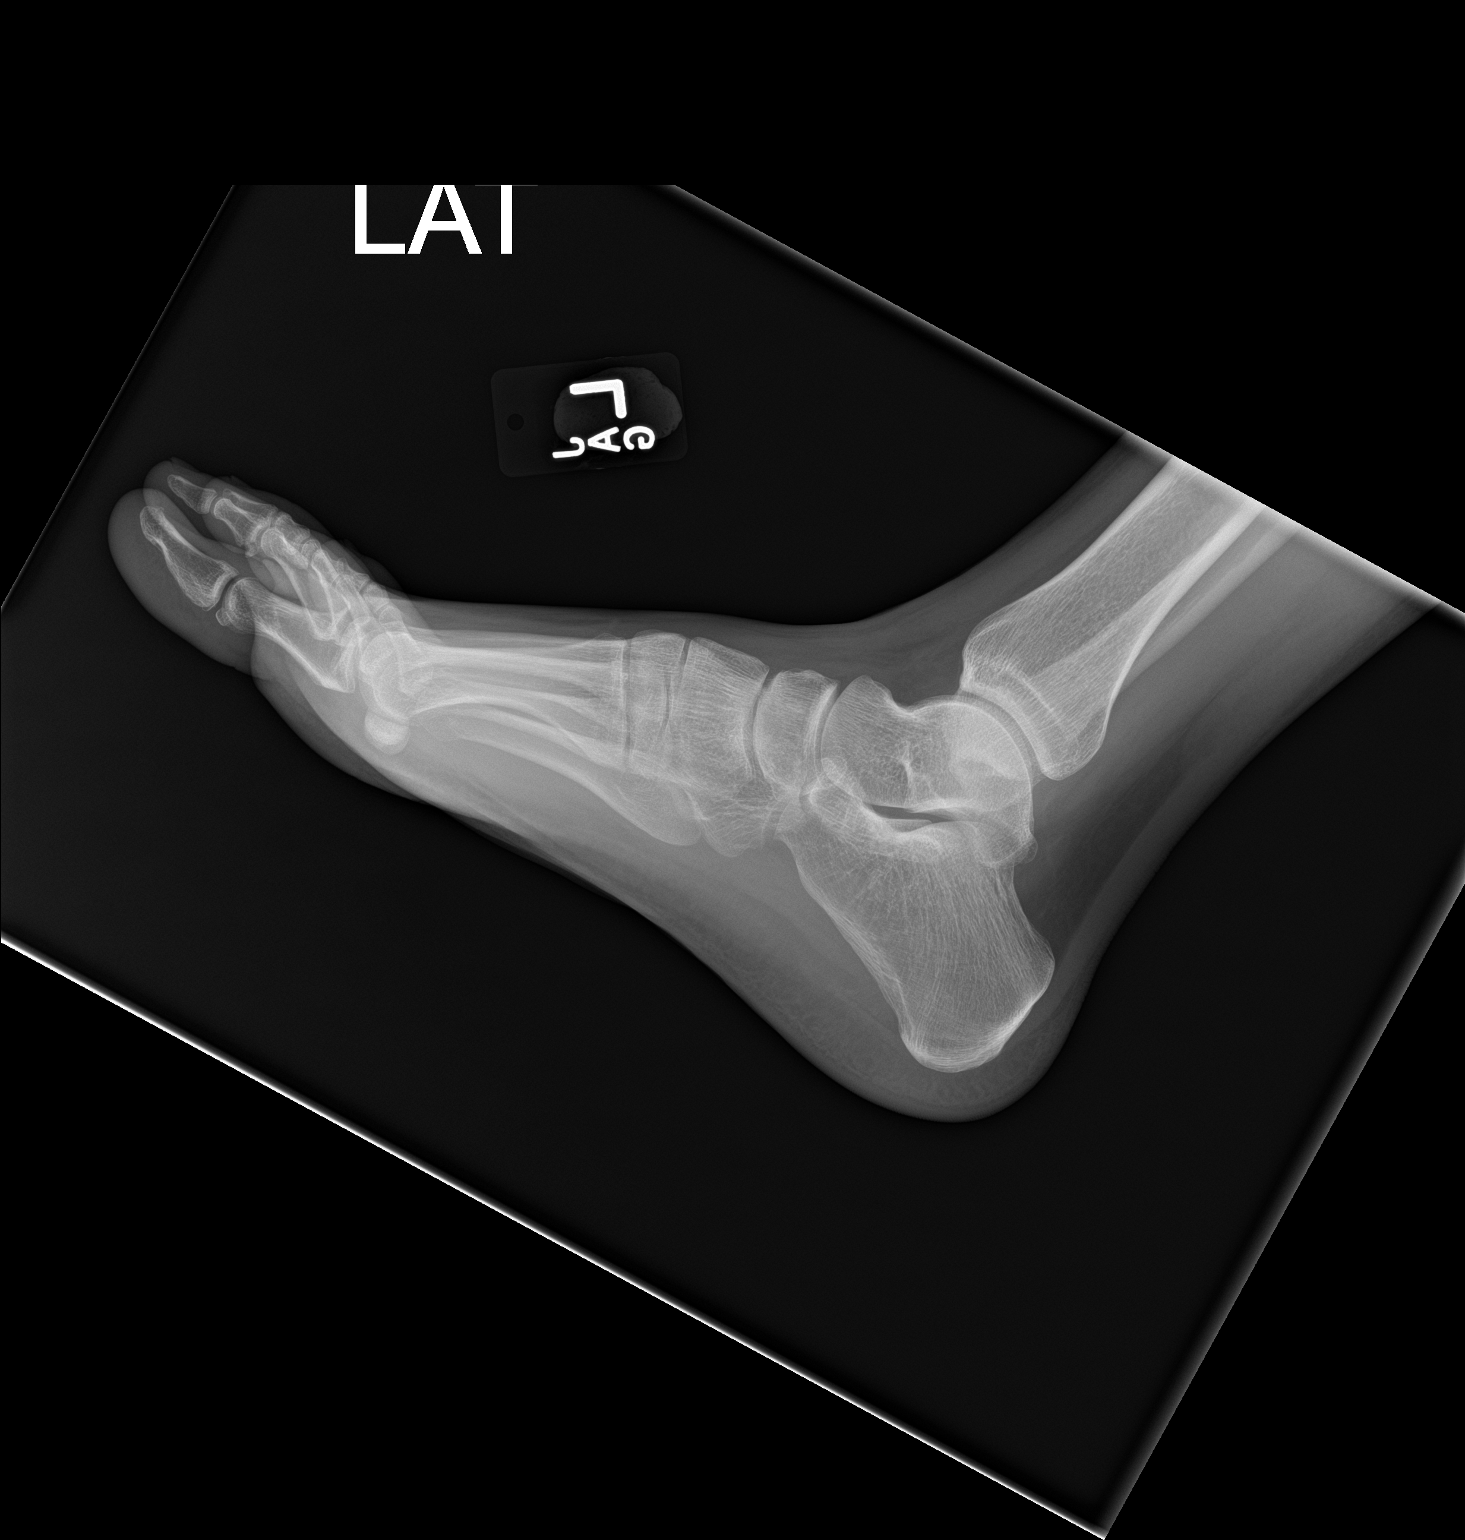

[3 of 3 positions shown; findings below may reference images not displayed]

FINDINGS: There is a fracture at the base of the left second toe middle
phalanx. Fracture fragment is minimally displaced. No additional
acute bony abnormality. Joint spaces are maintained.
IMPRESSION: Minimally displaced fracture at the base of the left second toe
middle phalanx.

## 2018-02-04 ENCOUNTER — Emergency Department (HOSPITAL_COMMUNITY)
Admission: EM | Admit: 2018-02-04 | Discharge: 2018-02-04 | Disposition: A | Discharge status: Home / Self Care | Payer: Self-pay | Attending: Emergency Medicine | Admitting: Emergency Medicine

## 2018-02-04 ENCOUNTER — Encounter (HOSPITAL_COMMUNITY): Payer: Self-pay | Admitting: Emergency Medicine

## 2018-02-04 ENCOUNTER — Emergency Department (HOSPITAL_COMMUNITY): Payer: Self-pay

## 2018-02-04 ENCOUNTER — Other Ambulatory Visit: Payer: Self-pay

## 2018-02-04 DIAGNOSIS — Y998 Other external cause status: Secondary | ICD-10-CM | POA: Insufficient documentation

## 2018-02-04 DIAGNOSIS — Y929 Unspecified place or not applicable: Secondary | ICD-10-CM | POA: Insufficient documentation

## 2018-02-04 DIAGNOSIS — W228XXA Striking against or struck by other objects, initial encounter: Secondary | ICD-10-CM | POA: Insufficient documentation

## 2018-02-04 DIAGNOSIS — S63501A Unspecified sprain of right wrist, initial encounter: Secondary | ICD-10-CM | POA: Insufficient documentation

## 2018-02-04 DIAGNOSIS — F17210 Nicotine dependence, cigarettes, uncomplicated: Secondary | ICD-10-CM | POA: Insufficient documentation

## 2018-02-04 DIAGNOSIS — M87039 Idiopathic aseptic necrosis of unspecified carpus: Secondary | ICD-10-CM | POA: Insufficient documentation

## 2018-02-04 DIAGNOSIS — Y9389 Activity, other specified: Secondary | ICD-10-CM | POA: Insufficient documentation

## 2018-02-04 HISTORY — DX: Fracture of unspecified carpal bone, unspecified wrist, initial encounter for closed fracture: S62.109A
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
## 2018-02-04 NOTE — Discharge Instructions (Addendum)
Your x-ray is negative for fracture or dislocation.  Your examination suggest a sprain of your right wrist.  Please use the wrist splint until the tenderness has improved.  Use Tylenol every 4 hours or ibuprofen every 6 hours for soreness.  Your x-ray indicates injury to your lunate bone on the previous injury.  You are now developing a condition called avascular necrosis.  Please see Dr. Romeo AppleHarrison, or the orthopedic specialist of your choice concerning this problem with your wrist bone.

## 2018-04-24 ENCOUNTER — Emergency Department (HOSPITAL_COMMUNITY): Payer: No Typology Code available for payment source

## 2018-04-24 ENCOUNTER — Emergency Department (HOSPITAL_COMMUNITY)
Admission: EM | Admit: 2018-04-24 | Discharge: 2018-04-24 | Disposition: A | Discharge status: Home / Self Care | Payer: No Typology Code available for payment source | Attending: Emergency Medicine | Admitting: Emergency Medicine

## 2018-04-24 ENCOUNTER — Other Ambulatory Visit: Payer: Self-pay

## 2018-04-24 ENCOUNTER — Encounter (HOSPITAL_COMMUNITY): Payer: Self-pay | Admitting: Emergency Medicine

## 2018-04-24 DIAGNOSIS — Y929 Unspecified place or not applicable: Secondary | ICD-10-CM | POA: Insufficient documentation

## 2018-04-24 DIAGNOSIS — Z79899 Other long term (current) drug therapy: Secondary | ICD-10-CM | POA: Insufficient documentation

## 2018-04-24 DIAGNOSIS — F17210 Nicotine dependence, cigarettes, uncomplicated: Secondary | ICD-10-CM | POA: Diagnosis not present

## 2018-04-24 DIAGNOSIS — Y939 Activity, unspecified: Secondary | ICD-10-CM | POA: Insufficient documentation

## 2018-04-24 DIAGNOSIS — Y999 Unspecified external cause status: Secondary | ICD-10-CM | POA: Diagnosis not present

## 2018-04-24 DIAGNOSIS — M542 Cervicalgia: Secondary | ICD-10-CM | POA: Diagnosis not present

## 2018-04-24 MED ORDER — IBUPROFEN 800 MG PO TABS: 800.0000 mg | ORAL_TABLET | Freq: Three times a day (TID) | ORAL | 0 refills | Status: AC

## 2018-04-24 MED ORDER — METHOCARBAMOL 500 MG PO TABS: 500.0000 mg | ORAL_TABLET | Freq: Two times a day (BID) | ORAL | 0 refills | Status: AC

## 2018-04-24 NOTE — ED Notes (Signed)
From CT   POs offered

## 2018-04-24 NOTE — ED Notes (Signed)
To rad 

## 2018-04-24 NOTE — ED Notes (Signed)
Friends in room  Awaiting CT

## 2018-04-24 NOTE — Discharge Instructions (Signed)
Return if any problems. Stop smoking

## 2018-04-24 NOTE — ED Notes (Signed)
From Rad 
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
## 2020-09-02 ENCOUNTER — Emergency Department (HOSPITAL_COMMUNITY)
Admission: EM | Admit: 2020-09-02 | Discharge: 2020-09-03 | Disposition: A | Discharge status: Home / Self Care | Payer: Self-pay | Attending: Emergency Medicine | Admitting: Emergency Medicine

## 2020-09-02 ENCOUNTER — Other Ambulatory Visit: Payer: Self-pay

## 2020-09-02 ENCOUNTER — Encounter (HOSPITAL_COMMUNITY): Payer: Self-pay | Admitting: *Deleted

## 2020-09-02 DIAGNOSIS — F17210 Nicotine dependence, cigarettes, uncomplicated: Secondary | ICD-10-CM | POA: Insufficient documentation

## 2020-09-02 DIAGNOSIS — F1120 Opioid dependence, uncomplicated: Secondary | ICD-10-CM | POA: Insufficient documentation

## 2020-09-02 LAB — RAPID URINE DRUG SCREEN, HOSP PERFORMED
Amphetamines: NOT DETECTED
Barbiturates: NOT DETECTED
Benzodiazepines: NOT DETECTED
Cocaine: NOT DETECTED
Opiates: NOT DETECTED
Tetrahydrocannabinol: NOT DETECTED

## 2020-09-02 NOTE — ED Triage Notes (Signed)
Pt requesting help with detox from heroin, been using for past 10 years.  Pt has tried calling multiple places and keeps being referred here to the ER. Pt states he has no insurance. Pt denies any SI or HI.

## 2020-09-03 NOTE — ED Notes (Addendum)
Spoke with SW. [facility] transportation is on the way to get pt. Waiver signed. Pt given something to eat and drink.

## 2020-09-03 NOTE — ED Provider Notes (Signed)
[facility]-EMERGENCY DEPT [facility] Emergency Department Provider Note MRN:  [id number]  Arrival date & time: 09/03/20     Chief Complaint   Detox History of Present Illness   Jacob Pennington is a 30 y.o. year-old male with a history of heroin use disorder presenting to the ED with chief complaint of detox.  Patient explains that he has been using heroin for the past 10 years.  Initially was just starting it for the first 2 years but then began using intravenously.  He now has a young child and due to his drug issue he is not able to be around the child.  He is ready to quit using but he needs help.  Explains that he uses every day and if he does not go into a detox center he will continue to use.  Denies SI or HI or AVH.  He called multiple places and was consistently advised to come to the emergency department for help.  Last used at 3 PM, currently feels fine without any withdrawal.  Review of Systems  A complete 10 system review of systems was obtained and all systems are negative except as noted in the HPI and PMH.   Patient's Health History    Past Medical History:  Diagnosis Date  . Seizures (HCC)    as a child due to blunt force trauma,   . Wrist fracture    right    History reviewed. No pertinent surgical history.  History reviewed. No pertinent family history.  Social History   Socioeconomic History  . Marital status: Single    Spouse name: Not on file  . Number of children: Not on file  . Years of education: Not on file  . Highest education level: Not on file  Occupational History  . Not on file  Tobacco Use  . Smoking status: Current Every Day Smoker    Packs/day: 1.00    Types: Cigarettes  . Smokeless tobacco: Never Used  Vaping Use  . Vaping Use: Never used  Substance and Sexual Activity  . Alcohol use: Not Currently    Comment: occasionally  . Drug use: Yes    Types: IV    Comment: heroin  . Sexual activity: Not on file  Other Topics Concern  . Not  on file  Social History Narrative  . Not on file   Social Determinants of Health   Financial Resource Strain: Not on file  Food Insecurity: Not on file  Transportation Needs: Not on file  Physical Activity: Not on file  Stress: Not on file  Social Connections: Not on file  Intimate Partner Violence: Not on file     Physical Exam   Vitals:   09/02/20 2132 09/03/20 0613  BP: 125/87 114/83  Pulse: (!) 109 73  Resp: 18 19  Temp: 99.1 F (37.3 C) 98.9 F (37.2 C)  SpO2: 100% 98%    CONSTITUTIONAL: Well-appearing, NAD NEURO:  Alert and oriented x 3, no focal deficits EYES:  eyes equal and reactive ENT/NECK:  no LAD, no JVD CARDIO: Regular rate, well-perfused, normal S1 and S2 PULM:  CTAB no wheezing or rhonchi GI/GU:  normal bowel sounds, non-distended, non-tender MSK/SPINE:  No gross deformities, no edema SKIN:  no rash, atraumatic PSYCH:  Appropriate speech and behavior  *Additional and/or pertinent findings included in MDM below  Diagnostic and Interventional Summary    EKG Interpretation  Date/Time:    Ventricular Rate:    PR Interval:    QRS Duration:  QT Interval:    QTC Calculation:   R Axis:     Text Interpretation:        Labs Reviewed  RAPID URINE DRUG SCREEN, HOSP PERFORMED    No orders to display    Medications - No data to display   Procedures  /  Critical Care Procedures  ED Course and Medical Decision Making  I have reviewed the triage vital signs, the nursing notes, and pertinent available records from the EMR.  Listed above are laboratory and imaging tests that I personally ordered, reviewed, and interpreted and then considered in my medical decision making (see below for details).  Patient mostly needs outpatient resources for heroin detox.  He seems to genuinely want assistance, wants to quit.  We will see what case management/social work and offer him later in the morning.  Signed out to default provider.       Elmer Sow. Pilar Plate,  MD [facility] Emergency Medicine [facility] [email or web address]  Final Clinical Impressions(s) / ED Diagnoses     ICD-10-CM   1. Heroin use disorder, severe, dependence (HCC)  F11.20     ED Discharge Orders    None       Discharge Instructions Discussed with and Provided to Patient:   Discharge Instructions   None       Sabas Sous, MD 09/03/20 [phone]

## 2020-09-03 NOTE — ED Provider Notes (Signed)
Blood pressure 114/83, pulse 73, temperature 98.9 F (37.2 C), resp. rate 19, height 5\' 7"  (1.702 m), weight 56.7 kg, SpO2 98 %.  Assuming care from Dr. .  In short, Jacob Pennington is a 30 y.o. male with a chief complaint of V70.1 .  Refer to the original H&P for additional details.  09:55 AM  Patient seen by SW who provided resources. Plan for discharge.     26, MD 09/03/20 [phone]

## 2020-09-03 NOTE — ED Notes (Signed)
Spoke with Meagan,SW.  Aware of consult and is working on it.  Nurse informed.

## 2020-09-03 NOTE — Clinical Social Work Note (Signed)
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 TOC received consult regarding inpatient/outpatient detox services. CSW provided patient with substance use resources for the community. Patient reported he wanted to go to a detox facility and was told he needed to come to the ED in order to be placed. CSW explained that if a detox bed cannot be found today, the patient will need to continue contacting facilities on his own after discharge.  CSW called [facility] and spoke with Jacob Pennington. Per Jacob Pennington, there are no detox beds available today and [facility] does not offer detox for opiates/heroin and referred CSW to [facility].  CSW called [facility] and spoke with Jacob Pennington. Per Jacob Pennington, there are detox beds available and requested that CSW fax documentation to [phone]. CSW provided patient with contact information for [facility] to complete a phone screening. Patient stated, "I was specifically told not to go there." CSW explained this is the only option available in the area that provides detox for heroin. Patient agreeable to following up with [facility] if the other outpatient resources do not work out.  CSW called [facility] to set up transportation. RN to assist with [employer] form. TOC signing off.  ED Mini Assessment: What brought you to the Emergency Department? : Detox Barriers to Discharge: ED Transportation,ED Active Substance Abuse,ED Barriers Resolved Barrier interventions: Contacted [facility] and [facility] for detox beds Means of departure: Other (enter comment) ([facility]) Interventions which prevented an admission or readmission: Nurse, children's (must enter comment) (Substance use  resources)  Patient Contact and Communications Key Contact 1: [facility] Jacob Pennington) Key Contact 2: [facility] Jacob Pennington) Contact Date: 09/03/20     Call outcome: [facility] does not detox opiates/heroin; [facility] has detox beds available Patient states their goals for this hospitalization and ongoing recovery are:: Get detoxed off heroin Choice offered to / list presented to : Patient  Admission diagnosis:  Detox There are no problems to display for this patient.  PCP:  Patient, No Pcp Per Pharmacy:  No Pharmacies Listed

## 2020-09-03 NOTE — Discharge Instructions (Signed)
The social work provider has seen you and provided the available resources.
